# Patient Record
Sex: Male | Born: 1995 | Hispanic: No | Marital: Single | Smoking: Former smoker
Health system: Southern US, Community
[De-identification: ages and names within clinical notes are randomized; demographics above are authoritative.]

## PROBLEM LIST (undated history)

## (undated) DIAGNOSIS — L709 Acne, unspecified: Secondary | ICD-10-CM

## (undated) HISTORY — DX: Acne, unspecified: L70.9

---
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
## 2014-05-05 ENCOUNTER — Emergency Department: Payer: Self-pay | Admitting: Emergency Medicine

## 2015-06-11 ENCOUNTER — Emergency Department
Admission: EM | Admit: 2015-06-11 | Discharge: 2015-06-11 | Disposition: A | Discharge status: Home / Self Care | Payer: Self-pay | Attending: Emergency Medicine | Admitting: Emergency Medicine

## 2015-06-11 ENCOUNTER — Encounter: Payer: Self-pay | Admitting: Emergency Medicine

## 2015-06-11 DIAGNOSIS — K120 Recurrent oral aphthae: Secondary | ICD-10-CM | POA: Insufficient documentation

## 2015-06-11 NOTE — ED Provider Notes (Signed)
[facility] Emergency Department Provider Note ____________________________________________  Time seen: 1347  I have reviewed the triage vital signs and the nursing notes.  HISTORY  Chief Complaint  Lymphadenopathy  HPI Steven Yu is a 19 y.o. male reports to the ED for evaluation and treatment of some swelling to the gumline. He noted a bump on her tongue this morning, and has also found [place]: Tender lymph node on the same side of his neck. He does report incidentally, that he has a dental appointment scheduled for Monday. He denies any recent fevers, chills, sweats and has not had a difficulty with swallowing, speaking, or breathing.  History reviewed. No pertinent past medical history.  There are no active problems to display for this patient.  History reviewed. No pertinent past surgical history.  No current outpatient prescriptions on file.  Allergies Review of patient's allergies indicates no known allergies.  No family history on file.  Social History History  Substance Use Topics  . Smoking status: Never Smoker   . Smokeless tobacco: Not on file  . Alcohol Use: Yes   Review of Systems  Constitutional: Negative for fever. Eyes: Negative for visual changes. ENT: Negative for sore throat. Gum and tongue irritation as above. Cardiovascular: Negative for chest pain. Respiratory: Negative for shortness of breath. Gastrointestinal: Negative for abdominal pain, vomiting and diarrhea. Genitourinary: Negative for dysuria. Musculoskeletal: Negative for back pain. Skin: Negative for rash. Neurological: Negative for headaches, focal weakness or numbness. ____________________________________________  PHYSICAL EXAM:  VITAL SIGNS: ED Triage Vitals  Enc Vitals Group     BP 06/11/15 1250 144/74 mmHg     Pulse Rate 06/11/15 1250 64     Resp 06/11/15 1250 20     Temp 06/11/15 1250 98.4 F (36.9 C)     Temp Source 06/11/15 1250 Oral     SpO2  06/11/15 1250 100 %     Weight 06/11/15 1250 260 lb (117.935 kg)     Height 06/11/15 1250 6\' 2"  (1.88 m)     Head Cir --      Peak Flow --      Pain Score --      Pain Loc --      Pain Edu? --      Excl. in [place]? --    Constitutional: Alert and oriented. Well appearing and in no distress. Eyes: Conjunctivae are normal. PERRL. Normal extraocular movements. ENT   Head: Normocephalic and atraumatic.   Nose: No congestion/rhinnorhea.   Mouth/Throat: Mucous membranes are moist. Local erythema noted to single, shallow, aphthous ulcer to the left lateral tongue near the base.    Neck: Supple. No thyromegaly. Hematological/Lymphatic/Immunilogical: No cervical lymphadenopathy. Cardiovascular: Normal rate, regular rhythm.  Respiratory: Normal respiratory effort. No wheezes/rales/rhonchi. Musculoskeletal: Nontender with normal range of motion in all extremities.  Neurologic:  Normal gait without ataxia. Normal speech and language. No gross focal neurologic deficits are appreciated. Skin:  Skin is warm, dry and intact. No rash noted. Psychiatric: Mood and affect are normal. Patient exhibits appropriate insight and judgment. ____________________________________________  INITIAL IMPRESSION / ASSESSMENT AND PLAN / ED COURSE  Acute mouth pain due to aphthous ulcer. Will suggest treatment with Maalox/Benadryl swish & spit. Follow-up with primary provider or dental provider as scheduled.  ____________________________________________  FINAL CLINICAL IMPRESSION(S) / ED DIAGNOSES  Final diagnoses:  Aphthous ulcer     Melvenia Needles, PA-C 06/13/15 1647

## 2015-06-11 NOTE — ED Notes (Signed)
States he noticed some swelling to gumline and now has a swollen gland

## 2015-06-11 NOTE — Discharge Instructions (Signed)
Canker Sores  Canker sores are painful, open sores on the inside of the mouth and cheek. They may be white or yellow. The sores usually heal in 1 to 2 weeks. Women are more likely than men to have recurrent canker sores. CAUSES The cause of canker sores is not well understood. More than one cause is likely. Canker sores do not appear to be caused by certain types of germs (viruses or bacteria). Canker sores may be caused by:  An allergic reaction to certain foods.  Digestive problems.  Not having enough vitamin M09, folic acid, and iron.  Male sex hormones. Sores may come only during certain phases of a menstrual cycle. Often, there is improvement during pregnancy.  Genetics. Some people seem to inherit canker sore problems. Emotional stress and injuries to the mouth may trigger outbreaks, but not cause them.  DIAGNOSIS Canker sores are diagnosed by exam.  TREATMENT  Patients who have frequent bouts of canker sores may have cultures taken of the sores, blood tests, or allergy tests. This helps determine if their sores are caused by a poor diet, an allergy, or some other preventable or treatable disease.  Vitamins may prevent recurrences or reduce the severity of canker sores in people with poor nutrition.  Numbing ointments can relieve pain. These are available in drug stores without a prescription.  Anti-inflammatory steroid mouth rinses or gels may be prescribed by your caregiver for severe sores.  Oral steroids may be prescribed if you have severe, recurrent canker sores. These strong medicines can cause many side effects and should be used only under the close direction of a dentist or physician.  Mouth rinses containing the antibiotic medicine may be prescribed. They may lessen symptoms and speed healing. Healing usually happens in about 1 or 2 weeks with or without treatment. Certain antibiotic mouth rinses given to pregnant women and young children can permanently stain teeth.  Talk to your caregiver about your treatment. HOME CARE INSTRUCTIONS   Avoid foods that cause canker sores for you.  Avoid citrus juices, spicy or salty foods, and coffee until the sores are healed.  Use a soft-bristled toothbrush.  Chew your food carefully to avoid biting your cheek.  Apply topical numbing medicine to the sore to help relieve pain.  Apply a thin paste of baking soda and water to the sore to help heal the sore.  Only use mouth rinses or medicines for pain or discomfort as directed by your caregiver. SEEK MEDICAL CARE IF:   Your symptoms are not better in 1 week.  Your sores are still present after 2 weeks.  Your sores are very painful.  You have trouble breathing or swallowing.  Your sores come back frequently. Document Released: 02/17/2011 Document Revised: 02/17/2013 Document Reviewed: 02/17/2011 [facility] Patient Information 2015 [place], [place]. This information is not intended to replace advice given to you by your health care provider. Make sure you discuss any questions you have with your health care provider.  Consider mixing & gargling equal parts: Benadryl (diphenhydramine) elixir + Maalox (Mylanta) for mouth pain as needed.

## 2015-07-15 ENCOUNTER — Emergency Department: Payer: Self-pay

## 2015-07-15 ENCOUNTER — Emergency Department
Admission: EM | Admit: 2015-07-15 | Discharge: 2015-07-15 | Disposition: A | Discharge status: Home / Self Care | Payer: Self-pay | Attending: Emergency Medicine | Admitting: Emergency Medicine

## 2015-07-15 DIAGNOSIS — K219 Gastro-esophageal reflux disease without esophagitis: Secondary | ICD-10-CM | POA: Insufficient documentation

## 2015-07-15 DIAGNOSIS — E860 Dehydration: Secondary | ICD-10-CM | POA: Insufficient documentation

## 2015-07-15 DIAGNOSIS — R079 Chest pain, unspecified: Secondary | ICD-10-CM | POA: Insufficient documentation

## 2015-07-15 LAB — CBC WITH DIFFERENTIAL/PLATELET
Basophils Absolute: 0 10*3/uL (ref 0–0.1)
Basophils Relative: 0 %
EOS PCT: 1 %
Eosinophils Absolute: 0.1 10*3/uL (ref 0–0.7)
HCT: 45 % (ref 40.0–52.0)
HEMOGLOBIN: 14.9 g/dL (ref 13.0–18.0)
LYMPHS ABS: 2 10*3/uL (ref 1.0–3.6)
LYMPHS PCT: 19 %
MCH: 28.2 pg (ref 26.0–34.0)
MCHC: 33 g/dL (ref 32.0–36.0)
MCV: 85.3 fL (ref 80.0–100.0)
MONOS PCT: 9 %
Monocytes Absolute: 0.9 10*3/uL (ref 0.2–1.0)
Neutro Abs: 7.3 10*3/uL — ABNORMAL HIGH (ref 1.4–6.5)
Neutrophils Relative %: 71 %
PLATELETS: 298 10*3/uL (ref 150–440)
RBC: 5.28 MIL/uL (ref 4.40–5.90)
RDW: 12.8 % (ref 11.5–14.5)
WBC: 10.3 10*3/uL (ref 3.8–10.6)

## 2015-07-15 LAB — BASIC METABOLIC PANEL
Anion gap: 9 (ref 5–15)
BUN: 22 mg/dL — AB (ref 6–20)
CHLORIDE: 108 mmol/L (ref 101–111)
CO2: 23 mmol/L (ref 22–32)
CREATININE: 1.57 mg/dL — AB (ref 0.61–1.24)
Calcium: 9.6 mg/dL (ref 8.9–10.3)
GFR calc Af Amer: >60 mL/min (ref >60)
GFR calc non Af Amer: >60 mL/min (ref >60)
GLUCOSE: 93 mg/dL (ref 65–99)
POTASSIUM: 3.5 mmol/L (ref 3.5–5.1)
Sodium: 140 mmol/L (ref 135–145)

## 2015-07-15 LAB — TROPONIN I: Troponin I: <0.03 ng/mL (ref <0.031)

## 2015-07-15 MED ORDER — ALUM & MAG HYDROXIDE-SIMETH 200-200-20 MG/5ML PO SUSP
30.0000 mL | Freq: Once | ORAL | Status: AC
  Administered 2015-07-15: 30 mL via ORAL
  Filled 2015-07-15: qty 30

## 2015-07-15 MED ORDER — RANITIDINE HCL 150 MG PO TABS: ORAL_TABLET | ORAL | Status: DC

## 2015-07-15 NOTE — Discharge Instructions (Signed)
Your chest discomfort may have been due to some mild dehydration or it may have been due to esophageal reflux. 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 Take ranitidine as prescribed. Follow-up with another doctor for any ongoing symptoms. Return to the emergency department if you have worsening symptoms or other acute concerns.  Chest Pain (Nonspecific) It is often hard to give a diagnosis for the cause of chest pain. There is always a chance that your pain could be related to something serious, such as a heart attack or a blood clot in the lungs. You need to follow up with your doctor. HOME CARE  If antibiotic medicine was given, take it as directed by your doctor. Finish the medicine even if you start to feel better.  For the next few days, avoid activities that bring on chest pain. Continue physical activities as told by your doctor.  Do not use any tobacco products. This includes cigarettes, chewing tobacco, and e-cigarettes.  Avoid drinking alcohol.  Only take medicine as told by your doctor.  Follow your doctor's suggestions for more testing if your chest pain does not go away.  Keep all doctor visits you made. GET HELP IF:  Your chest pain does not go away, even after treatment.  You have a rash with blisters on your chest.  You have a fever. GET HELP RIGHT AWAY IF:   You have more pain or pain that spreads to your arm, neck, jaw, back, or belly (abdomen).  You have shortness of breath.  You cough more than usual or cough up blood.  You have very bad back or belly pain.  You feel sick to your stomach (nauseous) or throw up (vomit).  You have very bad weakness.  You pass out (faint).  You have chills. This is an emergency. Do not wait to see if the problems will go away. Call your local emergency services (911 in [place] Do not drive yourself to the hospital. MAKE SURE YOU:   Understand these instructions.  Will watch your condition.  Will get help  right away if you are not doing well or get worse. Document Released: 04/10/2008 Document Revised: 10/28/2013 Document Reviewed: 04/10/2008 [facility] Patient Information 2015 [place], [place]. This information is not intended to replace advice given to you by your health care provider. Make sure you discuss any questions you have with your health care provider.

## 2015-07-15 NOTE — ED Notes (Signed)
Pt arrived to room 2 via EMS,  Reports that he started having chest pain after he had been working in the heat.   midsternal chest pain, did not radiate, + diaphoresis- pt states that it lasted until he got in the ambulance ( approx 30 min).  Pt states pain is gone now and he is only c/o "heart burn" epigastric burning.  Pt reports that he has been having severe reflux for the past month but today is the first time he has felt pain lower than his throat-epigastrum.

## 2015-07-15 NOTE — ED Provider Notes (Signed)
[facility] Emergency Department Provider Note  ____________________________________________  Time seen:  1550  I have reviewed the triage vital signs and the nursing notes.   HISTORY  Chief Complaint Chest Pain     HPI Steven Yu is a 19 y.o. male who began to develop left-sided chest discomfort this afternoon after leaving work.  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 He had no difficulty while working. He left work on his large motorcycle (a CBR 1000) and as he was riding he began to develop an odd sensation, some tingling and pressure, in his left chest. He rode to a [employer] where he got his Sam which. When he began to eat the pain got even worse and he felt a bit weak and uncomfortable. He wondered if his blood sugar was dropping. 911 was called. His blood sugar was normal on their exam. He was placed in the ambulance to be brought to the emergency department. The patient reports that once in the ambulance his chest pain resolved. Total duration was likely to be about 30 minutes. He denies any nausea. He denies any shortness of breath.  Mr. Steven Yu does report that he has a long history of esophageal reflux, but I do not believe he's ever been evaluated for this. He denies ever taking any medications for it other than Tums.  He denies any other health issues and does not take any medications.   Past medical history: None except for possibility of gastroesophageal reflux disease.  There are no active problems to display for this patient.   No past surgical history on file.  Current Outpatient Rx  Name  Route  Sig  Dispense  Refill  . ranitidine (ZANTAC) 150 MG tablet      Take one tablet twice a day for one week then 1 tablet once a day for 2 more weeks.   28 tablet   1     Allergies Review of patient's allergies indicates no known allergies.  History reviewed. No pertinent family history.  Social  History Social History  Substance Use Topics  . Smoking status: Never Smoker   . Smokeless tobacco: None  . Alcohol Use: No    Review of Systems  Constitutional: Negative for fever. ENT: Negative for sore throat. Cardiovascular: Positive for chest pain. Respiratory: Negative for shortness of breath. Gastrointestinal: Negative for abdominal pain, vomiting and diarrhea. Genitourinary: Negative for dysuria. Musculoskeletal: No myalgias or injuries. Skin: Negative for rash. Neurological: Negative for headaches   10-point ROS otherwise negative.  ____________________________________________   PHYSICAL EXAM:  VITAL SIGNS: ED Triage Vitals  Enc Vitals Group     BP --      Pulse --      Resp --      Temp --      Temp src --      SpO2 --      Weight --      Height --      Head Cir --      Peak Flow --      Pain Score --      Pain Loc --      Pain Edu? --      Excl. in [place]? --     Constitutional: Alert and oriented. Well appearing and in no distress. ENT   Head: Normocephalic and atraumatic.   Nose: No congestion/rhinnorhea.   Mouth/Throat: Mucous membranes are moist. Cardiovascular: Normal rate, regular rhythm, no murmur noted  Chest wall: No tenderness, no deformity. Respiratory:  Normal respiratory effort, no tachypnea.    Breath sounds are clear and equal bilaterally.  Gastrointestinal: Soft and nontender. No distention.  Back: No muscle spasm, no tenderness, no CVA tenderness. Musculoskeletal: No deformity noted. Nontender with normal range of motion in all extremities.  No noted edema. Neurologic:  Normal speech and language. No gross focal neurologic deficits are appreciated.  Skin:  Skin is warm, dry. No rash noted. Psychiatric: Pleasant, calm, interactive. Mood and affect are normal. Speech and behavior are normal.  ____________________________________________    LABS (pertinent positives/negatives)  Labs Reviewed  CBC WITH  DIFFERENTIAL/PLATELET - Abnormal; Notable for the following:    Neutro Abs 7.3 (*)    All other components within normal limits  BASIC METABOLIC PANEL - Abnormal; Notable for the following:    BUN 22 (*)    Creatinine, Ser 1.57 (*)    All other components within normal limits  TROPONIN I     ____________________________________________   EKG  ED ECG REPORT I, Korrie Hofbauer W, the attending physician, personally viewed and interpreted this ECG.   Date: 07/15/2015  EKG Time: 1546  Rate: 91  Rhythm: Normal sinus rhythm  Axis: Normal  Intervals: Normal  ST&T Change: None noted   ____________________________________________    RADIOLOGY  Chest x-ray:  IMPRESSION: No active cardiopulmonary disease.  ____________________________________________   INITIAL IMPRESSION / ASSESSMENT AND PLAN / ED COURSE  Pertinent labs & imaging results that were available during my care of the patient were reviewed by me and considered in my medical decision making (see chart for details).  Pleasant, calm, 19 year old male in no acute distress who reports he had discomfort in his left chest. This is resolved. The patient appears to be at very low risk for this being a cardiac event. He has no chest wall tenderness. He does report a history of esophageal reflux and is most likely related to that. We will treat him with Maalox. We will check an EKG and troponin.   ----------------------------------------- 5:18 PM on 07/15/2015 -----------------------------------------  Lab tests are overall unremarkable. The patient does have a slight elevation in BUN of 22 and creatinine at 1.57. The patient does have an IV and has received IV fluids.  His troponin is negative as is his chest x-ray. On reassessment the patient reports he feels much better. He looks better as well. He looks comfortable. We will discharge him home. I counseled him to be sure to drink more fluids when he is working outside. He  reports he drank approximately 2 quarts today. Given that the temperature got to be in the high 90s today, I counseled him he may need to drink as much as 6 quarts.  ____________________________________________   FINAL CLINICAL IMPRESSION(S) / ED DIAGNOSES  Final diagnoses:  Chest pain, unspecified chest pain type  Dehydration  Gastroesophageal reflux disease, esophagitis presence not specified      Ahmed Prima, MD 07/15/15 [phone]

## 2015-08-24 ENCOUNTER — Encounter: Payer: Self-pay | Admitting: Emergency Medicine

## 2015-08-24 ENCOUNTER — Emergency Department
Admission: EM | Admit: 2015-08-24 | Discharge: 2015-08-24 | Disposition: A | Discharge status: Home / Self Care | Payer: Self-pay | Attending: Emergency Medicine | Admitting: Emergency Medicine

## 2015-08-24 ENCOUNTER — Emergency Department: Payer: Self-pay

## 2015-08-24 DIAGNOSIS — Y998 Other external cause status: Secondary | ICD-10-CM | POA: Insufficient documentation

## 2015-08-24 DIAGNOSIS — X58XXXA Exposure to other specified factors, initial encounter: Secondary | ICD-10-CM | POA: Insufficient documentation

## 2015-08-24 DIAGNOSIS — S9031XA Contusion of right foot, initial encounter: Secondary | ICD-10-CM | POA: Insufficient documentation

## 2015-08-24 DIAGNOSIS — Y9289 Other specified places as the place of occurrence of the external cause: Secondary | ICD-10-CM | POA: Insufficient documentation

## 2015-08-24 DIAGNOSIS — Y9339 Activity, other involving climbing, rappelling and jumping off: Secondary | ICD-10-CM | POA: Insufficient documentation

## 2015-08-24 MED ORDER — IBUPROFEN 800 MG PO TABS
800.0000 mg | ORAL_TABLET | Freq: Once | ORAL | Status: AC
  Administered 2015-08-24: 800 mg via ORAL

## 2015-08-24 MED ORDER — IBUPROFEN 800 MG PO TABS: 800.0000 mg | ORAL_TABLET | Freq: Three times a day (TID) | ORAL | Status: DC | PRN

## 2015-08-24 MED ORDER — HYDROCODONE-ACETAMINOPHEN 5-325 MG PO TABS
2.0000 | ORAL_TABLET | Freq: Once | ORAL | Status: AC
  Administered 2015-08-24: 2 via ORAL

## 2015-08-24 NOTE — Discharge Instructions (Signed)

## 2015-08-24 NOTE — ED Provider Notes (Signed)
[facility] Emergency Department Provider Note  ____________________________________________  Time seen: Approximately 11:17 PM  I have reviewed the triage vital signs and the nursing notes.   HISTORY  Chief Complaint Foot Pain    HPI Steven Yu is a 19 y.o. male who presents for evaluation of right foot pain after jumping off a wall. Patient states that he previously injured it and reinjured again tonight. Complains of increased pain to his heel.   No past medical history on file.  There are no active problems to display for this patient.   History reviewed. No pertinent past surgical history.  Current Outpatient Rx  Name  Route  Sig  Dispense  Refill  . ibuprofen (ADVIL,MOTRIN) 800 MG tablet   Oral   Take 1 tablet (800 mg total) by mouth every 8 (eight) hours as needed.   30 tablet   0   . ranitidine (ZANTAC) 150 MG tablet      Take one tablet twice a day for one week then 1 tablet once a day for 2 more weeks.   28 tablet   1     Allergies Review of patient's allergies indicates no known allergies.  No family history on file.  Social History Social History  Substance Use Topics  . Smoking status: Never Smoker   . Smokeless tobacco: None  . Alcohol Use: No    Review of Systems Constitutional: No fever/chills Eyes: No visual changes. ENT: No sore throat. Cardiovascular: Denies chest pain. Respiratory: Denies shortness of breath. Gastrointestinal: No abdominal pain.  No nausea, no vomiting.  No diarrhea.  No constipation. Genitourinary: Negative for dysuria. Musculoskeletal: Positive for right foot pain Skin: Negative for rash. Neurological: Negative for headaches, focal weakness or numbness.  10-point ROS otherwise negative.  ____________________________________________   PHYSICAL EXAM:  VITAL SIGNS: ED Triage Vitals  Enc Vitals Group     BP 08/24/15 2239 140/100 mmHg     Pulse Rate 08/24/15 2239 112   Resp 08/24/15 2239 20     Temp 08/24/15 2239 97.9 F (36.6 C)     Temp Source 08/24/15 2239 Oral     SpO2 08/24/15 2239 98 %     Weight 08/24/15 2239 285 lb (129.275 kg)     Height 08/24/15 2239 6\' 2"  (1.88 m)     Head Cir --      Peak Flow --      Pain Score 08/24/15 2237 8     Pain Loc --      Pain Edu? --      Excl. in [place]? --     Constitutional: Alert and oriented. Well appearing and in no acute distress. Cardiovascular: Normal rate, regular rhythm. Grossly normal heart sounds.  Good peripheral circulation. Respiratory: Normal respiratory effort.  No retractions. Lungs CTAB. Musculoskeletal: No lower extremity tenderness nor edema.  No joint effusions. Neurologic:  Normal speech and language. No gross focal neurologic deficits are appreciated. No gait instability. Skin:  Skin is warm, dry and intact. No rash noted. Psychiatric: Mood and affect are normal. Speech and behavior are normal.  ____________________________________________   LABS (all labs ordered are listed, but only abnormal results are displayed)  Labs Reviewed - No data to display ____________________________________________  RADIOLOGY  FINDINGS: There is no evidence of fracture or dislocation. The joint spaces are preserved. There is no evidence of talar subluxation; the subtalar joint is unremarkable in appearance. There is a bipartite medial sesamoid of the first toe.  No  significant soft tissue abnormalities are seen.  IMPRESSION: 1. No evidence of fracture or dislocation. 2. 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 ____________________________________________   PROCEDURES  Procedure(s) performed: None  Critical Care performed: No  ____________________________________________   INITIAL IMPRESSION / ASSESSMENT AND PLAN / ED COURSE  Pertinent labs & imaging results that were available during my care of the patient were reviewed by me and considered in my medical decision making (see  chart for details).  Acute right foot contusion. Patient placed in a posterior splint and encouraged crutches use for the next 2-3 days. Follow up with orthopedic on call if no relief or change in one week. Rx Motrin 800 mg 3 times a day given. ____________________________________________   FINAL CLINICAL IMPRESSION(S) / ED DIAGNOSES  Final diagnoses:  Foot contusion, right, initial encounter      Arlyss Repress, PA-C 08/24/15 [place], MD 08/25/15 [phone]

## 2015-08-24 NOTE — ED Notes (Signed)
Pt to triage via w/c with no distress noted; pt reports injuring right foot/heel approx 5wks ago after jumping off wall; tonight reinjured and c/o pain since

## 2015-08-24 NOTE — ED Notes (Signed)
Patient transported to X-ray 

## 2015-09-18 ENCOUNTER — Emergency Department: Payer: Self-pay

## 2015-09-18 ENCOUNTER — Emergency Department
Admission: EM | Admit: 2015-09-18 | Discharge: 2015-09-18 | Disposition: A | Discharge status: Home / Self Care | Payer: Self-pay | Attending: Emergency Medicine | Admitting: Emergency Medicine

## 2015-09-18 DIAGNOSIS — D179 Benign lipomatous neoplasm, unspecified: Secondary | ICD-10-CM | POA: Insufficient documentation

## 2015-09-18 DIAGNOSIS — Z79899 Other long term (current) drug therapy: Secondary | ICD-10-CM | POA: Insufficient documentation

## 2015-09-18 DIAGNOSIS — M940 Chondrocostal junction syndrome [Tietze]: Secondary | ICD-10-CM | POA: Insufficient documentation

## 2015-09-18 MED ORDER — NAPROXEN 500 MG PO TABS: 500.0000 mg | ORAL_TABLET | Freq: Two times a day (BID) | ORAL | Status: AC

## 2015-09-18 NOTE — Discharge Instructions (Signed)
Costochondritis  Costochondritis is a condition in which the tissue (cartilage) that connects your ribs with your breastbone (sternum) becomes irritated. It causes pain in the chest and rib area. It usually goes away on its own over time.  HOME CARE  · Avoid activities that wear you out.  · Do not strain your ribs. Avoid activities that use your:    Chest.    Belly.    Side muscles.  · Put ice on the area for the first 2 days after the pain starts.    Put ice in a plastic bag.    Place a towel between your skin and the bag.    Leave the ice on for 20 minutes, 2-3 times a day.  · Only take medicine as told by your doctor.  GET HELP IF:  · You have redness or puffiness (swelling) in the rib area.  · Your pain does not go away with rest or medicine.  GET HELP RIGHT AWAY IF:   · Your pain gets worse.  · You are very uncomfortable.  · You have trouble breathing.  · You cough up blood.  · You start sweating or throwing up (vomiting).  · You have a fever or lasting symptoms for more than 2-3 days.  · You have a fever and your symptoms suddenly get worse.  MAKE SURE YOU:   · Understand these instructions.  · Will watch your condition.  · Will get help right away if you are not doing well or get worse.     This information is not intended to replace advice given to you by your health care provider. Make sure you discuss any questions you have with your health care provider.     Document Released: 04/10/2008 Document Revised: 06/25/2013 Document Reviewed: 05/27/2013  [employer]vier Interactive Patient Education ©2016 [employer]

## 2015-09-18 NOTE — ED Provider Notes (Signed)
[facility] Emergency Department Provider Note  ____________________________________________  Time seen: Approximately 2:06 PM  I have reviewed the triage vital signs and the nursing notes.   HISTORY  Chief Complaint Pleurisy    HPI Steven Yu is a 19 y.o. male patient complain of tenderness to left side of his chest. Patient stated there is a increase pain with palpation and deep inspiration. Patient also concern about a lump is on the left side of his chest has been there for the past 2 weeks however is not palpable today. He stated he does have a history of lipomas. Patient currently rates his pain as a 3/10.No palliative measures taken for this complaint. Patient denies any dyspnea or shortness of breath.   History reviewed. No pertinent past medical history.  There are no active problems to display for this patient.   History reviewed. No pertinent past surgical history.  Current Outpatient Rx  Name  Route  Sig  Dispense  Refill  . ibuprofen (ADVIL,MOTRIN) 800 MG tablet   Oral   Take 1 tablet (800 mg total) by mouth every 8 (eight) hours as needed.   30 tablet   0   . ranitidine (ZANTAC) 150 MG tablet      Take one tablet twice a day for one week then 1 tablet once a day for 2 more weeks.   28 tablet   1     Allergies Review of patient's allergies indicates no known allergies.  No family history on file.  Social History Social History  Substance Use Topics  . Smoking status: Never Smoker   . Smokeless tobacco: None  . Alcohol Use: No    Review of Systems Constitutional: No fever/chills Eyes: No visual changes. ENT: No sore throat. Cardiovascular: Denies chest pain. Respiratory: Denies shortness of breath. Gastrointestinal: No abdominal pain.  No nausea, no vomiting.  No diarrhea.  No constipation. Genitourinary: Negative for dysuria. Musculoskeletal: Chest wall pain Skin: Negative for rash. Neurological: Negative for  headaches, focal weakness or numbness. 10-point ROS otherwise negative.  ____________________________________________   PHYSICAL EXAM:  VITAL SIGNS: ED Triage Vitals  Enc Vitals Group     BP 09/18/15 1323 138/88 mmHg     Pulse Rate 09/18/15 1323 58     Resp 09/18/15 1323 17     Temp 09/18/15 1323 98.4 F (36.9 C)     Temp Source 09/18/15 1323 Oral     SpO2 09/18/15 1323 98 %     Weight 09/18/15 1321 280 lb (127.007 kg)     Height 09/18/15 1321 6\' 2"  (1.88 m)     Head Cir --      Peak Flow --      Pain Score 09/18/15 1322 3     Pain Loc --      Pain Edu? --      Excl. in [place]? --     Constitutional: Alert and oriented. Well appearing and in no acute distress. Eyes: Conjunctivae are normal. PERRL. EOMI. Head: Atraumatic. Nose: No congestion/rhinnorhea. Mouth/Throat: Mucous membranes are moist.  Oropharynx non-erythematous. Neck: No stridor.  No cervical spine tenderness to palpation. Hematological/Lymphatic/Immunilogical: No cervical lymphadenopathy. Cardiovascular: Normal rate, regular rhythm. Grossly normal heart sounds.  Good peripheral circulation. Respiratory: Normal respiratory effort.  No retractions. Lungs CTAB. Gastrointestinal: Soft and nontender. No distention. No abdominal bruits. No CVA tenderness. Musculoskeletal: No lower extremity tenderness nor edema.  No joint effusions. Neurologic:  Normal speech and language. No gross focal neurologic deficits are  appreciated. No gait instability. Skin:  Skin is warm, dry and intact. No rash noted. Psychiatric: Mood and affect are normal. Speech and behavior are normal.  ____________________________________________   LABS (all labs ordered are listed, but only abnormal results are displayed)  Labs Reviewed - No data to display ____________________________________________  EKG  EKG showed on the sinus rhythm. EKG was evaluated by heart station doctor. ____________________________________________  RADIOLOGY  Sex  x-ray was grossly unremarkable. ____________________________________________   PROCEDURES  Procedure(s) performed:   Critical Care performed:   ____________________________________________   INITIAL IMPRESSION / ASSESSMENT AND PLAN / ED COURSE  Pertinent labs & imaging results that were available during my care of the patient were reviewed by me and considered in my medical decision making (see chart for details).  Costochondritis. Discussed negative chest x-ray results with patient. Discussed the sequela of his current diagnosis. Patient given a prescription for naproxen take as directed. Patient advised to follow-up with the family doctor if his complaint persists. Advised return to ER if condition worsens. ____________________________________________   FINAL CLINICAL IMPRESSION(S) / ED DIAGNOSES  Final diagnoses:  Costochondritis  Lipoma      Sable Feil, PA-C 09/18/15 1448  Delman Kitten, MD 09/18/15 [phone]

## 2015-09-18 NOTE — ED Notes (Signed)
Pt c/o tenderness to the left side of chest , tender to touch, states he has felt a lump on the left side for the past 2 weeks.Marland Kitchen

## 2015-11-06 ENCOUNTER — Encounter: Payer: Self-pay | Admitting: Emergency Medicine

## 2016-06-05 ENCOUNTER — Other Ambulatory Visit: Payer: Self-pay | Admitting: Nurse Practitioner

## 2016-06-05 DIAGNOSIS — N63 Unspecified lump in unspecified breast: Secondary | ICD-10-CM

## 2016-06-09 ENCOUNTER — Ambulatory Visit
Admission: RE | Admit: 2016-06-09 | Discharge: 2016-06-09 | Disposition: A | Discharge status: Home / Self Care | Payer: Self-pay | Source: Ambulatory Visit | Attending: Nurse Practitioner | Admitting: Nurse Practitioner

## 2016-06-09 DIAGNOSIS — N63 Unspecified lump in unspecified breast: Secondary | ICD-10-CM

## 2017-10-22 ENCOUNTER — Encounter: Payer: Self-pay | Admitting: Nurse Practitioner

## 2017-10-22 ENCOUNTER — Ambulatory Visit: Payer: Self-pay | Admitting: Nurse Practitioner

## 2017-10-22 VITALS — BP 128/80 | HR 72 | Ht 73.0 in | Wt 322.8 lb

## 2017-10-22 DIAGNOSIS — E889 Metabolic disorder, unspecified: Secondary | ICD-10-CM | POA: Insufficient documentation

## 2017-10-22 DIAGNOSIS — E291 Testicular hypofunction: Secondary | ICD-10-CM | POA: Insufficient documentation

## 2017-10-22 DIAGNOSIS — E785 Hyperlipidemia, unspecified: Secondary | ICD-10-CM

## 2017-10-22 DIAGNOSIS — R2230 Localized swelling, mass and lump, unspecified upper limb: Secondary | ICD-10-CM

## 2017-10-22 MED ORDER — "SYRINGE 25G X 1"" 3 ML MISC": 2 refills | Status: DC

## 2017-10-22 MED ORDER — TESTOSTERONE 200 MG IL PLLT: 200.0000 mg | PELLET | 2 refills | Status: DC

## 2017-10-22 NOTE — Progress Notes (Signed)
Subjective:     Patient ID: Steven Yu, male   DOB: [birth year], 21 y.o.   MRN: [id number]  Superficial right chest pain associated with breast lump which has been evaluated and deemed benign. He is currently on testosterone injections 300mg  every 2 weeks. He is due to have refill. He is also due to have labs checked . He has noted a small, circular lump in middle of right forearm. Not tender. Denies injury to the area. It is not tender.      Review of Systems  Constitutional: Negative.   HENT: Negative for sore throat.   Eyes: Negative.   Respiratory: Negative for cough, shortness of breath and wheezing.   Cardiovascular: Negative for chest pain and palpitations.  Gastrointestinal: Negative.  Negative for constipation, diarrhea, nausea and vomiting.  Endocrine:       Chronic testosterone deficit  Genitourinary: Negative.   Musculoskeletal: Negative.   Skin: Negative.   Allergic/Immunologic: Negative.   Neurological: Negative.   Hematological: Negative.   Psychiatric/Behavioral: Negative.        Objective:   Physical Exam  Constitutional: He is oriented to person, place, and time. He appears well-developed and well-nourished.  HENT:  Head: Normocephalic and atraumatic.  Neck: Normal range of motion. Neck supple.  Cardiovascular: Normal rate and regular rhythm.  Pulmonary/Chest: Effort normal and breath sounds normal.  Abdominal: Soft. Bowel sounds are normal. There is no tenderness.  Musculoskeletal: Normal range of motion.  Lymphadenopathy:  Small 0.5cm nosduar denisty in right forearm . Round, smooth, and non-tender. It is not fixed in place.   Neurological: He is alert and oriented to person, place, and time.  Skin: Skin is warm and dry.  Psychiatric: He has a normal mood and affect.       Assessment:    Hyperlipidemia, unspecified hyperlipidemia type - Plan: Lipid Profile, Comprehensive Metabolic Panel (CMET)  Testicular hypofunction - Plan: Testosterone 200  MG PLLT, Syringe/Needle, Disp, (SYRINGE 3CC/25GX1") 25G X 1" 3 ML MISC, Testosterone, Prolactin  Localized swelling of forearm     Plan:     1. Testicular hypofunction - reill current rx for testosterone 300mg  every other week. Check testosterone levels as well as prolactin lefvel.  2. Hyperlipidemia - check fasting lipid panel and CMP. Adjust atorvastatin as indicated.  3. Localized swelling of forearm - bening palpable nodular mass. Continue to monitor. Will ultrasound nad refer as indicated.

## 2017-10-23 ENCOUNTER — Other Ambulatory Visit: Payer: Self-pay | Admitting: Nurse Practitioner

## 2017-10-23 ENCOUNTER — Telehealth: Payer: Self-pay | Admitting: Nurse Practitioner

## 2017-11-08 NOTE — Telephone Encounter (Signed)
Error

## 2018-01-22 ENCOUNTER — Ambulatory Visit: Payer: Self-pay | Admitting: Nurse Practitioner

## 2018-02-15 ENCOUNTER — Ambulatory Visit: Payer: Self-pay | Admitting: Nurse Practitioner

## 2018-02-15 ENCOUNTER — Encounter: Payer: Self-pay | Admitting: Nurse Practitioner

## 2018-02-15 VITALS — BP 137/73 | HR 78 | Resp 16 | Ht 73.5 in | Wt 310.8 lb

## 2018-02-15 DIAGNOSIS — N62 Hypertrophy of breast: Secondary | ICD-10-CM | POA: Insufficient documentation

## 2018-02-15 DIAGNOSIS — E291 Testicular hypofunction: Secondary | ICD-10-CM

## 2018-02-15 NOTE — Progress Notes (Signed)
[facility] [place], [place] [address]  Internal MEDICINE  Office Visit Note  Patient Name: Steven Yu  [id number]  [id number]  Date of Service: 02/15/2018  Chief Complaint  Patient presents with  . Follow-up    decreased testosterone levels.     The patient is here for routine follow up visit. He currently takes testosterone injections every other week. Has noted no worsening of symptoms. Still c/o gynecomastia. No changes noted.    Pt is here for routine follow up.    Current Medication: Outpatient Encounter Medications as of 02/15/2018  Medication Sig  . Syringe/Needle, Disp, (SYRINGE 3CC/25GX1") 25G X 1" 3 ML MISC To use with testosterone injection every other week  . Testosterone 200 MG PLLT Inject 200 mg into the muscle every 14 (fourteen) days. Inject 1.5 ml   im every 2 weeks  . atorvastatin (LIPITOR) 10 MG tablet Take 10 mg by mouth daily.  Marland Kitchen ibuprofen (ADVIL,MOTRIN) 800 MG tablet Take 1 tablet (800 mg total) by mouth every 8 (eight) hours as needed. (Patient not taking: Reported on 10/22/2017)  . ranitidine (ZANTAC) 150 MG tablet Take one tablet twice a day for one week then 1 tablet once a day for 2 more weeks. (Patient not taking: Reported on 10/22/2017)   No facility-administered encounter medications on file as of 02/15/2018.     Surgical History: History reviewed. No pertinent surgical history.  Medical History: History reviewed. No pertinent past medical history.  Family History: Family History  Problem Relation Age of Onset  . Depression Mother   . Hypertension Father     Social History   Socioeconomic History  . Marital status: Single    Spouse name: Not on file  . Number of children: Not on file  . Years of education: Not on file  . Highest education level: Not on file  Occupational History  . Not on file  Social Needs  . Financial resource strain: Not on file  . Food insecurity:    Worry: Not on file   Inability: Not on file  . Transportation needs:    Medical: Not on file    Non-medical: Not on file  Tobacco Use  . Smoking status: Current Every Day Smoker  . Smokeless tobacco: Never Used  . Tobacco comment: marijuana  Substance and Sexual Activity  . Alcohol use: No  . Drug use: Yes    Types: Marijuana    Comment: socially  . Sexual activity: Yes  Lifestyle  . Physical activity:    Days per week: Not on file    Minutes per session: Not on file  . Stress: Not on file  Relationships  . Social connections:    Talks on phone: Not on file    Gets together: Not on file    Attends religious service: Not on file    Active member of club or organization: Not on file    Attends meetings of clubs or organizations: Not on file    Relationship status: Not on file  . Intimate partner violence:    Fear of current or ex partner: Not on file    Emotionally abused: Not on file    Physically abused: Not on file    Forced sexual activity: Not on file  Other Topics Concern  . Not on file  Social History Narrative  . Not on file      Review of Systems  Constitutional: Negative for activity change, chills, fatigue and unexpected  weight change.  HENT: Negative for congestion, postnasal drip, rhinorrhea, sneezing and sore throat.   Eyes: Negative for redness.  Respiratory: Negative for cough, chest tightness, shortness of breath and wheezing.   Cardiovascular: Negative for chest pain and palpitations.  Gastrointestinal: Negative for abdominal pain, constipation, diarrhea, nausea and vomiting.  Endocrine: Negative for cold intolerance, heat intolerance, polydipsia, polyphagia and polyuria.       Testicular hypofunction.   Genitourinary: Negative for dysuria and frequency.  Musculoskeletal: Negative for arthralgias, back pain, joint swelling and neck pain.  Skin: Negative for rash.  Allergic/Immunologic: Negative for environmental allergies.  Neurological: Negative for tremors, numbness  and headaches.  Hematological: Negative for adenopathy. Does not bruise/bleed easily.  Psychiatric/Behavioral: Negative for behavioral problems (Depression), sleep disturbance and suicidal ideas. The patient is not nervous/anxious and is not hyperactive.     Today's Vitals   02/15/18 1422  BP: 137/73  Pulse: 78  Resp: 16  SpO2: 95%  Weight: (!) 310 lb 12.8 oz (141 kg)  Height: 6' 1.5" (1.867 m)    Physical Exam  Constitutional: He is oriented to person, place, and time.  HENT:  Head: Normocephalic and atraumatic.  Eyes: Pupils are equal, round, and reactive to light. EOM are normal.  Neck: Normal range of motion. Neck supple. No thyromegaly present.  Cardiovascular: Normal rate, regular rhythm and normal heart sounds.  Pulmonary/Chest: Effort normal and breath sounds normal. He has no wheezes.  Unchanged gynecomastia  Abdominal: Soft. Bowel sounds are normal. There is no tenderness.  Musculoskeletal: Normal range of motion.  Neurological: He is alert and oriented to person, place, and time.  Skin: Skin is warm and dry.  Psychiatric: He has a normal mood and affect. His behavior is normal. Judgment and thought content normal.  Nursing note and vitals reviewed.   Assessment/Plan: 1. Testicular hypofunction Currently on testosterone injections 200mg  per week. Will check testosterone and prolactin levels and adjust dosing as indicated/ Discussed future need for regular visits with endocrinologist. He did voice understanding.   2. Gynecomastia Check testosterone and prolactin levels and adjust testosterone dosing as indicated.   General Counseling: [facility] verbalizes understanding of the findings of todays visit and agrees with plan of treatment. I have discussed any further diagnostic evaluation that may be needed or ordered today. We also reviewed his medications today. he has been encouraged to call the office with any questions or concerns that should arise related to todays  visit.    This patient was seen by Leretha Pol, FNP- C in Collaboration with Dr Lavera Guise as a part of collaborative care agreement   Time spent: 55  Minutes      Dr Lavera Guise Internal medicine

## 2018-06-20 ENCOUNTER — Ambulatory Visit: Payer: Self-pay | Admitting: Adult Health

## 2018-06-20 ENCOUNTER — Encounter: Payer: Self-pay | Admitting: Adult Health

## 2018-06-20 VITALS — BP 145/96 | HR 105 | Resp 16 | Ht 74.0 in | Wt 301.4 lb

## 2018-06-20 DIAGNOSIS — E785 Hyperlipidemia, unspecified: Secondary | ICD-10-CM

## 2018-06-20 DIAGNOSIS — E291 Testicular hypofunction: Secondary | ICD-10-CM

## 2018-06-20 DIAGNOSIS — N62 Hypertrophy of breast: Secondary | ICD-10-CM

## 2018-06-20 DIAGNOSIS — R030 Elevated blood-pressure reading, without diagnosis of hypertension: Secondary | ICD-10-CM

## 2018-06-20 NOTE — Patient Instructions (Signed)
Testosterone Why am I having this test? Testosterone is a hormone made by the male's testicles and by the adrenal glands, which are a pair of glands on top of the kidneys. Starting at puberty, testosterone stimulates the development of secondary sex characteristics. This includes a deeper voice, growth of muscles and body hair, and penis enlargement. Females also produce testosterone in both the adrenal glands and ovaries. A male's body converts testosterone into estradiol, the main male sex hormone. An abnormal level of testosterone can cause health issues in both males and females. You may have this test if your health care provider suspects that an abnormal testosterone level is causing or contributing to other health problems. In males, symptoms of an abnormal testosterone level include:  Infertility.  Erectile dysfunction.  Delayed puberty or premature puberty.  In females, symptoms of an abnormally high testosterone level include:  Infertility.  Polycystic ovarian syndrome (PCOS).  Developing masculine features (virilization).  What kind of sample is taken? This test requires a blood sample taken from a vein in your arm or hand. The sample for this test is usually collected in the morning. The amount of testosterone in your blood is highest at that time. What do the results mean? It is your responsibility to obtain your test results. Ask the lab or department performing the test when and how you will get your results. Contact your health care provider to discuss any questions you have about your results. The result of a blood test for testosterone will be given as a range of values. A testosterone level that is outside the normal range may indicate a health problem. Testosterone is measured in nanograms per deciliter (ng/dL). Range of normal values Ranges for normal values may vary among different labs and hospitals. You should always check with your health care provider after  having lab work or other tests done to discuss whether your values are considered within normal limits. Normal levels of total testosterone are as follows:  Male: ? 7 months to 23 years old: less than 30 ng/dL. ? 10-13 years old: less than 300 ng/dL. ? 14-15 years old: 170-540 ng/dL. ? 16-19 years old: 250-910 ng/dL. ? 23 years old and over: 280-1,080 ng/dL.  Male: ? 7 months to 23 years old: less than 30 ng/dL. ? 10-13 years old: less than 40 ng/dL. ? 14-15 years old: less than 60 ng/dL. ? 16-19 years old: less than 70 ng/dL. ? 23 years old and over: less than 70 ng/dL.  Meaning of results outside normal value ranges A testosterone level that is too low or too high can indicate a number of health problems. In males:  A high testosterone level can occur if you: ? Have certain types of tumors. ? Have an overactive thyroid gland (hyperthyroidism). ? Use anabolic steroids. ? Are starting puberty early (precocious puberty). ? Have an inherited disorder that affects the adrenal glands (congenital adrenal hyperplasia).  A low testosterone level can occur if you: ? Have certain genetic diseases. ? Have had certain viral infections, such as mumps. ? Have pituitary disease. ? Have had an injury to the testicles. ? Are an alcoholic.  In females:  A high testosterone level can occur if you have: ? Certain types of tumors. ? An inherited disorder that affects certain cells in the adrenal glands (congenital adrenocortical hyperplasia). ? PCOS.  A low testosterone level does not cause health problems.  Discuss the results of your testosterone test with your health care provider. Your health care   provider will use the results of this test and other tests to make a diagnosis. Talk with your health care provider to discuss your results, treatment options, and if necessary, the need for more tests. Talk with your health care provider if you have any questions about your results. This  information is not intended to replace advice given to you by your health care provider. Make sure you discuss any questions you have with your health care provider. Document Released: 11/09/2004 Document Revised: 06/24/2016 Document Reviewed: 02/18/2014 Elsevier Interactive Patient Education  2018 [employer]  

## 2018-06-20 NOTE — Progress Notes (Signed)
[facility] [place], [place] [address]  Internal MEDICINE  Office Visit Note  Patient Name: Steven Yu  [id number]  [id number]  Date of Service: 07/11/2018  Chief Complaint  Patient presents with  . 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  He has not had his levels drawn since last visit because he has to pay [facility] before they will draw more blood from him.  Pt reports he is starting process to apply for insurance. He has not had a testosterone injection in approximately 2 months.  He denies side effects or new symptoms.  We discussed importance of seeing blood levels before giving him more testosterone. He reports he is not taking his Lipitor either due to financial reasons.      Current Medication: Outpatient Encounter Medications as of 06/20/2018  Medication Sig  . Syringe/Needle, Disp, (SYRINGE 3CC/25GX1") 25G X 1" 3 ML MISC To use with testosterone injection every other week  . Testosterone 200 MG PLLT Inject 200 mg into the muscle every 14 (fourteen) days. Inject 1.5 ml   im every 2 weeks  . atorvastatin (LIPITOR) 10 MG tablet Take 10 mg by mouth daily.  Marland Kitchen ibuprofen (ADVIL,MOTRIN) 800 MG tablet Take 1 tablet (800 mg total) by mouth every 8 (eight) hours as needed. (Patient not taking: Reported on 10/22/2017)  . ranitidine (ZANTAC) 150 MG tablet Take one tablet twice a day for one week then 1 tablet once a day for 2 more weeks. (Patient not taking: Reported on 10/22/2017)   No facility-administered encounter medications on file as of 06/20/2018.     Surgical History: History reviewed. No pertinent surgical history.  Medical History: Past Medical History:  Diagnosis Date  . Acne     Family History: Family History  Problem Relation Age of Onset  . Depression Mother   . Hypertension Father     Social History   Socioeconomic History  . Marital status: Single    Spouse name: Not on file  .  Number of children: Not on file  . Years of education: Not on file  . Highest education level: Not on file  Occupational History  . Not on file  Social Needs  . Financial resource strain: Not on file  . Food insecurity:    Worry: Not on file    Inability: Not on file  . Transportation needs:    Medical: Not on file    Non-medical: Not on file  Tobacco Use  . Smoking status: Former Research scientist (life sciences)  . Smokeless tobacco: Never Used  . Tobacco comment: marijuana  Substance and Sexual Activity  . Alcohol use: No  . Drug use: Not Currently    Types: Marijuana    Comment: socially  . Sexual activity: Yes  Lifestyle  . Physical activity:    Days per week: Not on file    Minutes per session: Not on file  . Stress: Not on file  Relationships  . Social connections:    Talks on phone: Not on file    Gets together: Not on file    Attends religious service: Not on file    Active member of club or organization: Not on file    Attends meetings of clubs or organizations: Not on file    Relationship status: Not on file  . Intimate partner violence:    Fear of current or ex partner: Not on file    Emotionally  abused: Not on file    Physically abused: Not on file    Forced sexual activity: Not on file  Other Topics Concern  . Not on file  Social History Narrative  . Not on file      Review of Systems  Constitutional: Negative.  Negative for chills, fatigue and unexpected weight change.  HENT: Negative.  Negative for congestion, rhinorrhea, sneezing and sore throat.   Eyes: Negative for redness.  Respiratory: Negative.  Negative for cough, chest tightness and shortness of breath.   Cardiovascular: Negative.  Negative for chest pain and palpitations.  Gastrointestinal: Negative.  Negative for abdominal pain, constipation, diarrhea, nausea and vomiting.  Endocrine: Negative.   Genitourinary: Negative.  Negative for dysuria and frequency.  Musculoskeletal: Negative.  Negative for  arthralgias, back pain, joint swelling and neck pain.  Skin: Negative.  Negative for rash.  Allergic/Immunologic: Negative.   Neurological: Negative.  Negative for tremors and numbness.  Hematological: Negative for adenopathy. Does not bruise/bleed easily.  Psychiatric/Behavioral: Negative.  Negative for behavioral problems, sleep disturbance and suicidal ideas. The patient is not nervous/anxious.     Vital Signs: BP (!) 145/96 (BP Location: Right Arm, Patient Position: Sitting, Cuff Size: Large)   Pulse (!) 105   Resp 16   Ht 6\' 2"  (1.88 m)   Wt (!) 301 lb 6.4 oz (136.7 kg)   SpO2 98%   BMI 38.70 kg/m    Physical Exam  Constitutional: He is oriented to person, place, and time. He appears well-developed and well-nourished. No distress.  HENT:  Head: Normocephalic and atraumatic.  Mouth/Throat: Oropharynx is clear and moist. No oropharyngeal exudate.  Eyes: Pupils are equal, round, and reactive to light. EOM are normal.  Neck: Normal range of motion. Neck supple. No JVD present. No tracheal deviation present. No thyromegaly present.  Cardiovascular: Normal rate, regular rhythm and normal heart sounds. Exam reveals no gallop and no friction rub.  No murmur heard. Pulmonary/Chest: Effort normal and breath sounds normal. No respiratory distress. He has no wheezes. He has no rales. He exhibits no tenderness.  Abdominal: Soft. There is no tenderness. There is no guarding.  Musculoskeletal: Normal range of motion.  Lymphadenopathy:    He has no cervical adenopathy.  Neurological: He is alert and oriented to person, place, and time. No cranial nerve deficit.  Skin: Skin is warm and dry. He is not diaphoretic.  Psychiatric: He has a normal mood and affect. His behavior is normal. Judgment and thought content normal.  Nursing note and vitals reviewed.   Assessment/Plan: 1. Testicular hypofunction Pt declined ordering more bloodwork because he can not afford to have it drawn. He  understands he needs his bloodwork in order to get his testosterone refilled. Pt given Free lab slip.  2. Gynecomastia Present, pt denies worse. Prolactin level has been ordered  3. Hyperlipidemia, unspecified hyperlipidemia type Not currently taking medications. Pt was educated about testosterone therapy can cause worsening in his lipid profile and he might have to take the medicine for it. 4. Elevated blood pressure reading Pt has elevated bp, will mointor at home, pt is noticed to be tachycardiac General Counseling: Maison verbalizes understanding of the findings of todays visit and agrees with plan of treatment. I have discussed any further diagnostic evaluation that may be needed or ordered today. We also reviewed his medications today. he has been encouraged to call the office with any questions or concerns that should arise related to todays visit.  Time spent: 25 Minutes  This patient was seen by Orson Gear AGNP-C in Collaboration with Dr Lavera Guise as a part of collaborative care agreement    Dr Lavera Guise Internal medicine

## 2018-08-01 ENCOUNTER — Encounter: Payer: Self-pay | Admitting: Adult Health

## 2019-11-29 ENCOUNTER — Emergency Department: Payer: Self-pay

## 2019-11-29 ENCOUNTER — Other Ambulatory Visit: Payer: Self-pay

## 2019-11-29 ENCOUNTER — Emergency Department
Admission: EM | Admit: 2019-11-29 | Discharge: 2019-11-29 | Disposition: A | Discharge status: Home / Self Care | Payer: Self-pay | Attending: Student | Admitting: Student

## 2019-11-29 DIAGNOSIS — M7651 Patellar tendinitis, right knee: Secondary | ICD-10-CM | POA: Insufficient documentation

## 2019-11-29 DIAGNOSIS — Z87891 Personal history of nicotine dependence: Secondary | ICD-10-CM | POA: Insufficient documentation

## 2019-11-29 NOTE — Discharge Instructions (Addendum)
Wear knee support and follow discharge care instruction.  Advised 600 mg ibuprofen 3 times a day for 3 to 5 days.

## 2019-11-29 NOTE — ED Triage Notes (Signed)
Pt arrived via POV to ER d/t right knee pain after getting out the shower yesterday. Pt states he was steeping out the shower and as he turned he felt his right knee pop. Pt is unable to fully bare weight on his right side. Pt denies any prior hx of injuries on the affected side.

## 2019-11-29 NOTE — ED Provider Notes (Signed)
[facility] Emergency Department Provider Note   ____________________________________________   First MD Initiated Contact with Patient 11/29/19 [phone]     (approximate)  I have reviewed the triage vital signs and the nursing notes.   HISTORY  Chief Complaint Knee Pain    HPI Steven Yu is a 24 y.o. male patient presents with 2 days of right knee pain.  Patient states there was a twisting incident as he stepped out of the shower and he felt a pop in his right knee.  Patient state unable to fully bear weight after the incident.  Patient rates pain as 5/10.  Patient described pain as "achy".  No palliative measure for complaint.         Past Medical History:  Diagnosis Date  . Acne     Patient Active Problem List   Diagnosis Date Noted  . Gynecomastia 02/15/2018  . Testicular hypofunction 10/22/2017  . Metabolic disorder 0000000    History reviewed. No pertinent surgical history.  Prior to Admission medications   Medication Sig Start Date End Date Taking? Authorizing Provider  atorvastatin (LIPITOR) 10 MG tablet Take 10 mg by mouth daily.    [provider]  ibuprofen (ADVIL,MOTRIN) 800 MG tablet Take 1 tablet (800 mg total) by mouth every 8 (eight) hours as needed. Patient not taking: Reported on 10/22/2017 08/24/15   Beers, Pierce Crane, PA-C  ranitidine (ZANTAC) 150 MG tablet Take one tablet twice a day for one week then 1 tablet once a day for 2 more weeks. Patient not taking: Reported on 10/22/2017 07/15/15   Ahmed Prima, MD  Syringe/Needle, Disp, (SYRINGE 3CC/25GX1") 25G X 1" 3 ML MISC To use with testosterone injection every other week 10/22/17   Ronnell Freshwater, NP  Testosterone 200 MG PLLT Inject 200 mg into the muscle every 14 (fourteen) days. Inject 1.5 ml   im every 2 weeks 10/22/17   Ronnell Freshwater, NP    Allergies Patient has no known allergies.  Family History  Problem Relation Age of Onset  .  Depression Mother   . Hypertension Father     Social History Social History   Tobacco Use  . Smoking status: Former Research scientist (life sciences)  . Smokeless tobacco: Never Used  . Tobacco comment: marijuana  Substance Use Topics  . Alcohol use: No  . Drug use: Not Currently    Types: Marijuana    Comment: socially    Review of Systems Constitutional: No fever/chills Eyes: No visual changes. ENT: No sore throat. Cardiovascular: Denies chest pain. Respiratory: Denies shortness of breath. Gastrointestinal: No abdominal pain.  No nausea, no vomiting.  No diarrhea.  No constipation. Genitourinary: Negative for dysuria. Musculoskeletal: Right knee pain.   Skin: Negative for rash. Neurological: Negative for headaches, focal weakness or numbness.   ____________________________________________   PHYSICAL EXAM:  VITAL SIGNS: ED Triage Vitals  Enc Vitals Group     BP 11/29/19 0920 (!) 159/98     Pulse Rate 11/29/19 0920 84     Resp 11/29/19 0920 16     Temp 11/29/19 0920 98.3 F (36.8 C)     Temp Source 11/29/19 0920 Oral     SpO2 11/29/19 0920 100 %     Weight 11/29/19 0923 290 lb (131.5 kg)     Height 11/29/19 0923 6\' 2"  (1.88 m)     Head Circumference --      Peak Flow --      Pain Score 11/29/19 0923 5  Pain Loc --      Pain Edu? --      Excl. in [place]? --    Constitutional: Alert and oriented. Well appearing and in no acute distress. Cardiovascular: Normal rate, regular rhythm. Grossly normal heart sounds.  Good peripheral circulation.  Elevated blood pressure. Respiratory: Normal respiratory effort.  No retractions. Lungs CTAB. Musculoskeletal: No obvious deformity/edema to the right knee.  No lower extremity tenderness nor edema.  No joint effusions. Neurologic:  Normal speech and language. No gross focal neurologic deficits are appreciated. No gait instability. Skin:  Skin is warm, dry and intact. No rash noted. Psychiatric: Mood and affect are normal. Speech and behavior are  normal.  ____________________________________________   LABS (all labs ordered are listed, but only abnormal results are displayed)  Labs Reviewed - No data to display ____________________________________________  EKG   ____________________________________________  RADIOLOGY  ED MD interpretation:    Official radiology report(s): DG Knee Complete 4 Views Right  Result Date: 11/29/2019 CLINICAL DATA:  24 year old male with a history of right knee pain EXAM: RIGHT KNEE - COMPLETE 4+ VIEW COMPARISON:  None. FINDINGS: No evidence of fracture, dislocation, or joint effusion. No evidence of arthropathy or other focal bone abnormality. Soft tissues are unremarkable. IMPRESSION: Negative. Electronically Signed   By: Corrie Mckusick D.O.   On: 11/29/2019 10:27    ____________________________________________   PROCEDURES  Procedure(s) performed (including Critical Care):  Procedures   ____________________________________________   INITIAL IMPRESSION / ASSESSMENT AND PLAN / ED COURSE  As part of my medical decision making, I reviewed the following data within the [place]     Patient presents with right knee pain secondary to a twisting incident yesterday.  Discussed negative x-ray findings with patient.  Physical exam is consistent with patella tendinitis.  Patient placed in knee immobilizer and given discharge care instructions.  Patient advised over-the-counter anti-inflammatory medication for 3 to 5 days.  Follow-up with PCP.    Steven Yu was evaluated in Emergency Department on 11/29/2019 for the symptoms described in the history of present illness. He was evaluated in the context of the global COVID-19 pandemic, which necessitated consideration that the patient might be at risk for infection with the SARS-CoV-2 virus that causes COVID-19. Institutional protocols and algorithms that pertain to the evaluation of patients at risk for COVID-19 are in a  state of rapid change based on information released by regulatory bodies including the CDC and federal and state organizations. These policies and algorithms were followed during the patient's care in the ED.       ____________________________________________   FINAL CLINICAL IMPRESSION(S) / ED DIAGNOSES  Final diagnoses:  Patellar tendinitis, right knee     ED Discharge Orders    None       Note:  This document was prepared using Dragon voice recognition software and may include unintentional dictation errors.    Sable Feil, PA-C 11/29/19 1040    Lilia Pro., MD 11/30/19 [phone]

## 2020-08-18 IMAGING — DX DG KNEE COMPLETE 4+V*R*
4 series · 4 of 4 positions shown · non-contrast
Comparison: None.

CLINICAL DATA: 23-year-old male with a history of right knee pain

EXAM:
RIGHT KNEE - COMPLETE 4+ VIEW

[knee lat]
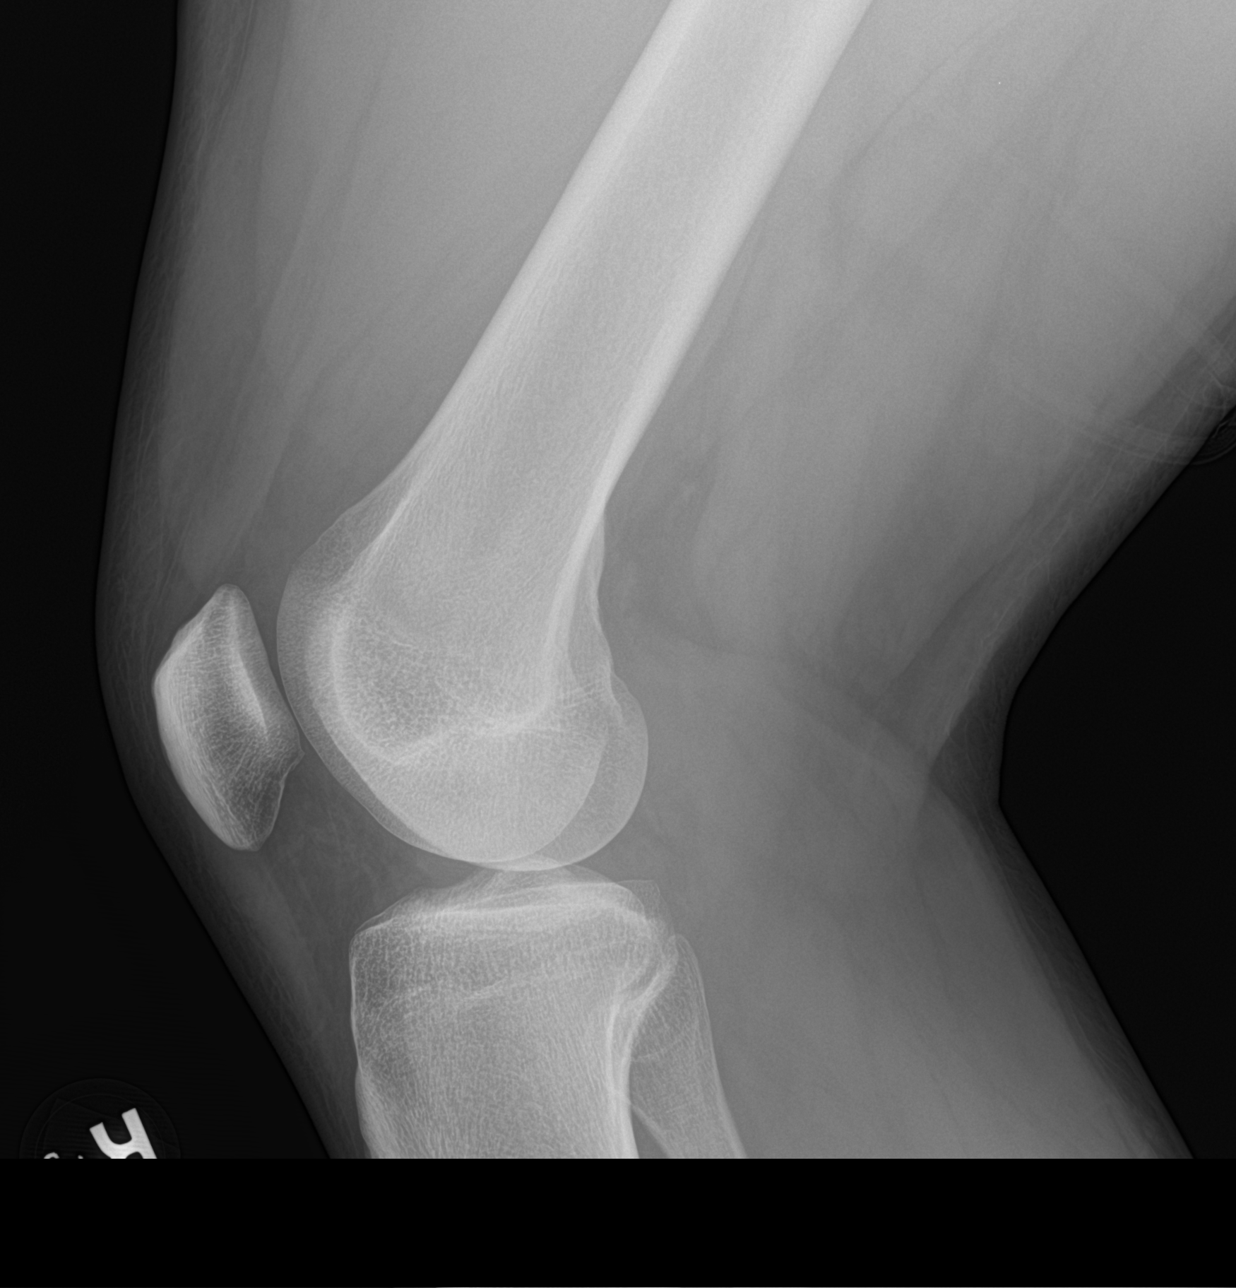

[knee ap]
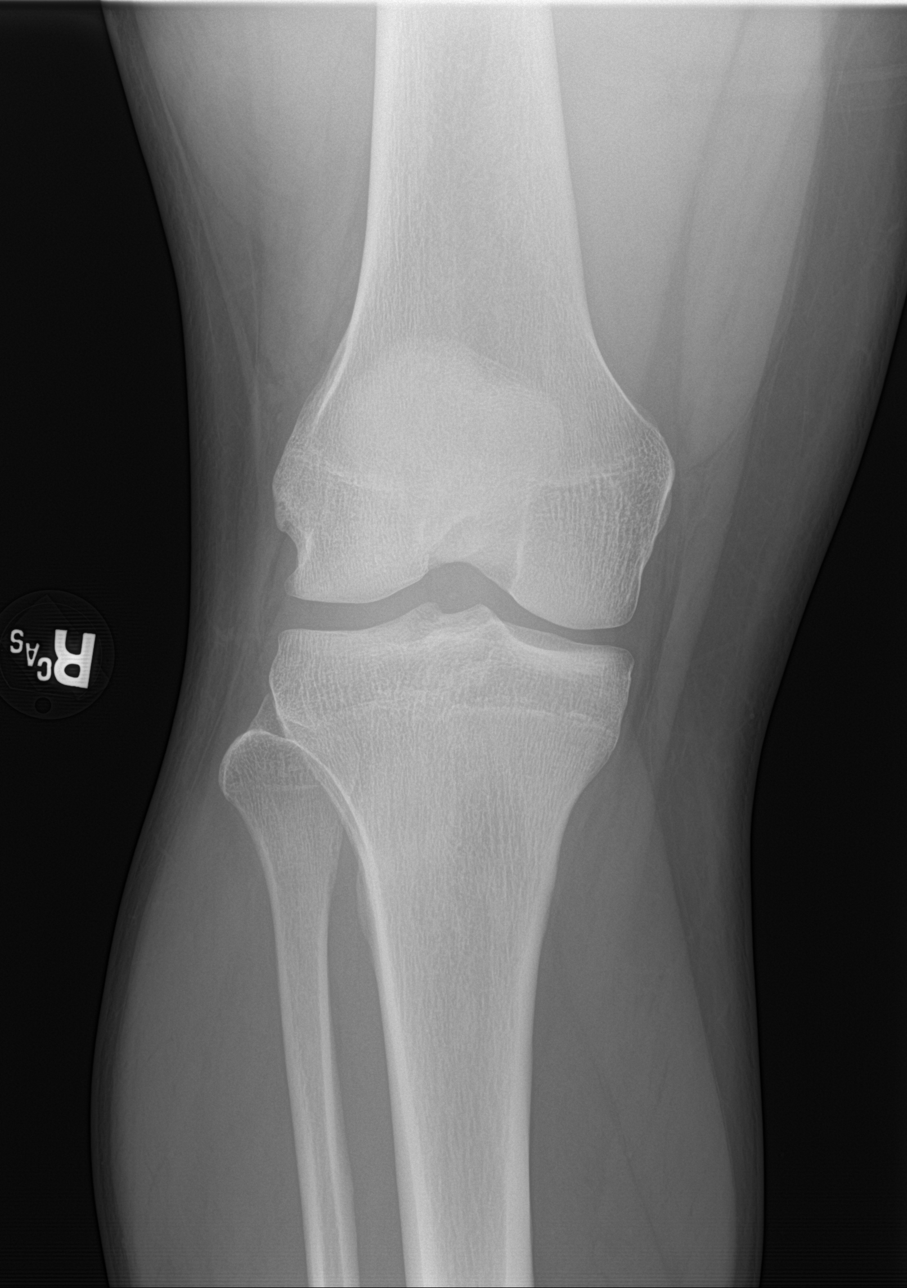

[knee obl (1 of 2)]
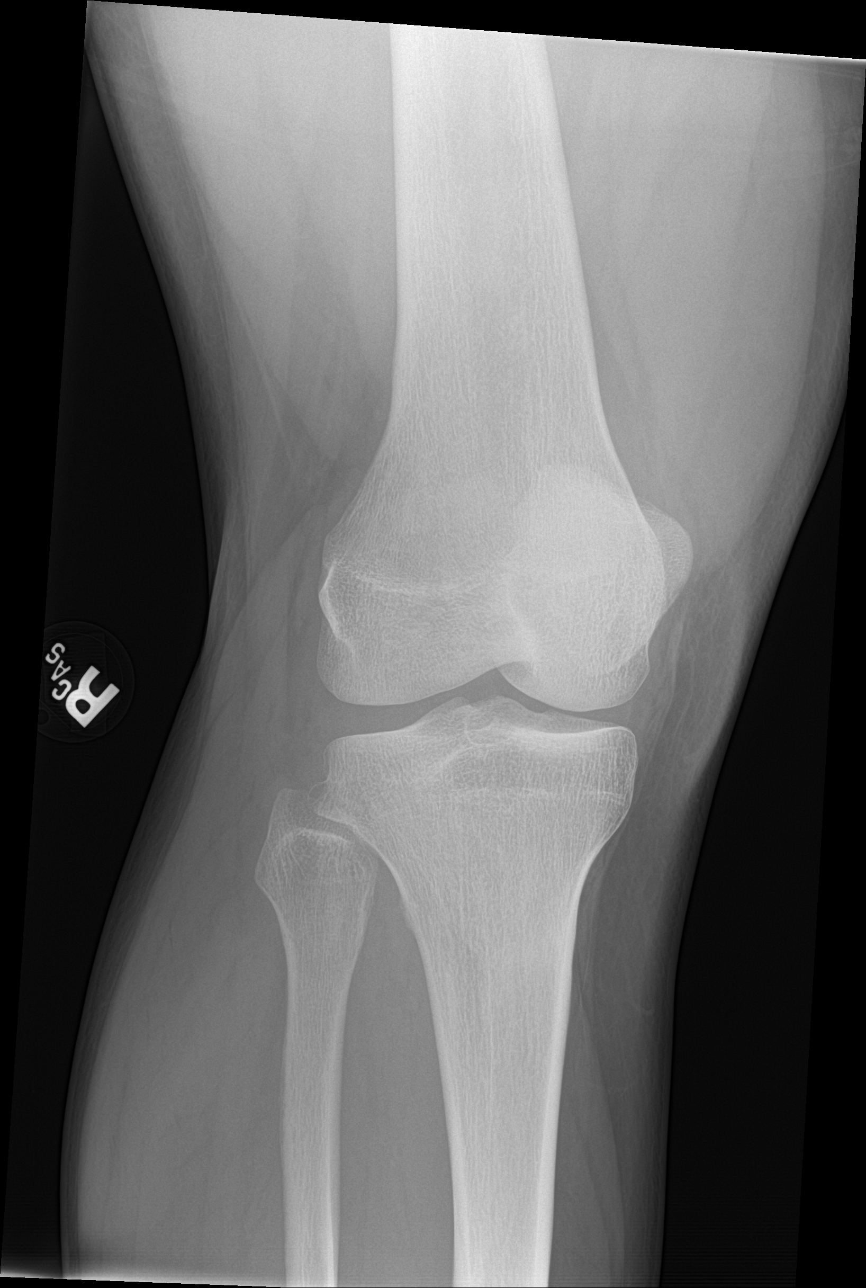

[knee obl (2 of 2)]
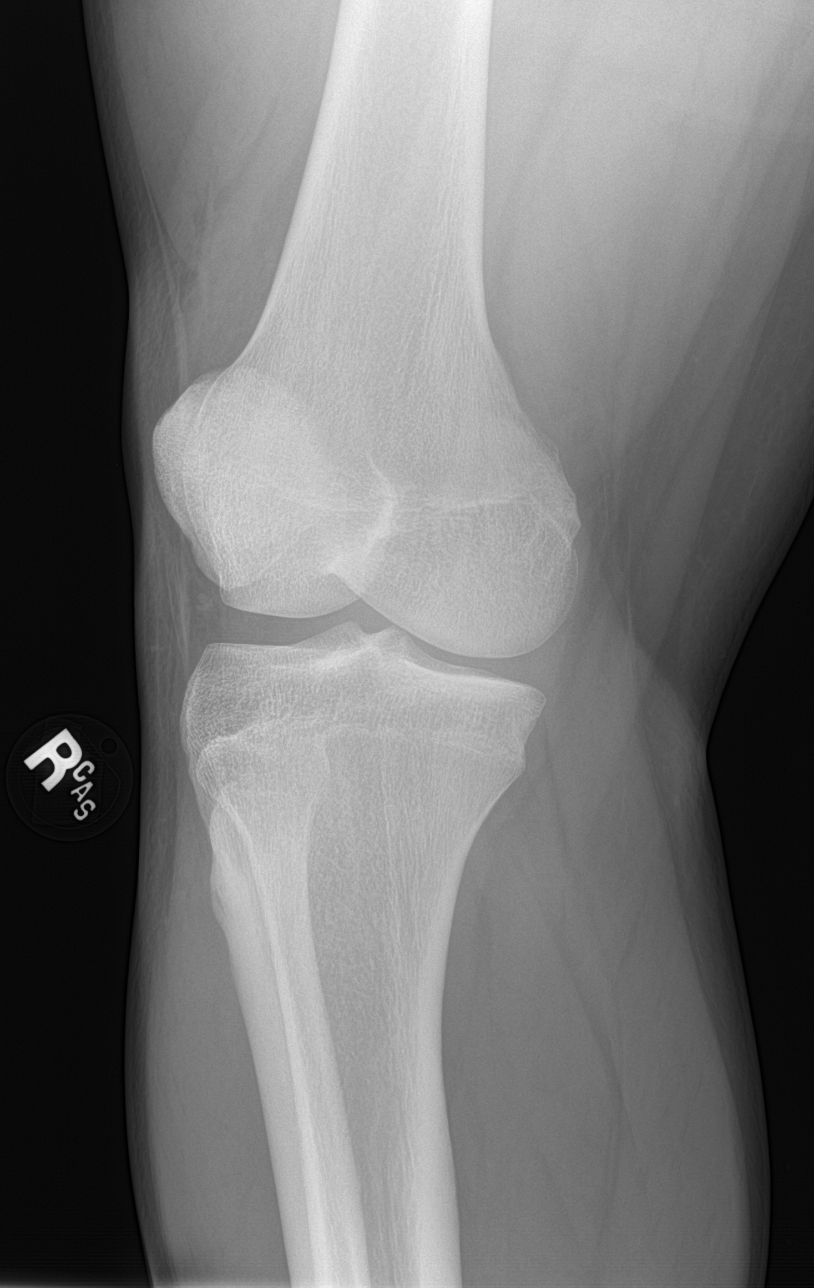

[4 of 4 positions shown; findings below may reference images not displayed]

FINDINGS: No evidence of fracture, dislocation, or joint effusion. No evidence
of arthropathy or other focal bone abnormality. Soft tissues are
unremarkable.
IMPRESSION: Negative.

## 2022-07-27 ENCOUNTER — Ambulatory Visit (INDEPENDENT_AMBULATORY_CARE_PROVIDER_SITE_OTHER): Payer: PRIVATE HEALTH INSURANCE | Admitting: Physician Assistant

## 2022-07-27 VITALS — BP 125/72 | HR 63 | Temp 98.4°F | Resp 16 | Ht 73.0 in | Wt 256.0 lb

## 2022-07-27 DIAGNOSIS — M79671 Pain in right foot: Secondary | ICD-10-CM

## 2022-07-27 DIAGNOSIS — R29898 Other symptoms and signs involving the musculoskeletal system: Secondary | ICD-10-CM | POA: Diagnosis not present

## 2022-07-27 DIAGNOSIS — Z7689 Persons encountering health services in other specified circumstances: Secondary | ICD-10-CM

## 2022-07-27 NOTE — Progress Notes (Signed)
[facility] [place], [place] [address]  Internal MEDICINE  Office Visit Note  Patient Name: Steven Yu  [id number]  [id number]  Date of Service: 08/02/2022   Complaints/HPI Pt is here for establishment of PCP. Chief Complaint  Patient presents with   New Patient (Initial Visit)   HPI Pt is here to reestablish care, he was last seen in office 4 years ago -he has been experiencing right foot pain, for almost a week -Last Friday it just started hurting when he got up. It will bother him walking to work. Hurts along lateral top portion and some numbness associated. He did golf the other day which he realizes probably didn't help as it started hurting more with this. He denies any injury, swelling, or bruising -Offered to go ahead and check xray vs refer to ortho and pt would like to see ortho. Did also explain that [facility] has walk-in/urgent care hours that he can schedule without referral, especially if any worsening prior to appt. 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 -marijuana smoker nightly, ecigs for 3 years. Will sometimes be able to quit but will pick back up again -He declines any labs or scheduling CPE at this time -Does not take any medication and reports he doesn't like to take anything  Current Medication: Outpatient Encounter Medications as of 07/27/2022  Medication Sig   [DISCONTINUED] atorvastatin (LIPITOR) 10 MG tablet Take 10 mg by mouth daily.   [DISCONTINUED] ibuprofen (ADVIL,MOTRIN) 800 MG tablet Take 1 tablet (800 mg total) by mouth every 8 (eight) hours as needed. (Patient not taking: Reported on  10/22/2017)   [DISCONTINUED] ranitidine (ZANTAC) 150 MG tablet Take one tablet twice a day for one week then 1 tablet once a day for 2 more weeks. (Patient not taking: Reported on 10/22/2017)   [DISCONTINUED] Syringe/Needle, Disp, (SYRINGE 3CC/25GX1") 25G X 1" 3 ML MISC To use with testosterone injection every other week   [DISCONTINUED] Testosterone 200 MG PLLT Inject 200 mg into the muscle every 14 (fourteen) days. Inject 1.5 ml   im every 2 weeks   No facility-administered encounter medications on file as of 07/27/2022.    Surgical History: No past surgical history on file.  Medical History: Past Medical History:  Diagnosis Date   Acne     Family History: Family History  Problem Relation Age of Onset   Depression Mother    Hypertension Father     Social History   Socioeconomic History   Marital status: Single    Spouse name: Not on file   Number of children: Not on file   Years of education: Not on file   Highest education level: Not on file  Occupational History   Not on file  Tobacco Use   Smoking status: Former   Smokeless tobacco: Never   Tobacco comments:    marijuana  Vaping Use   Vaping Use: Some days  Substance and Sexual Activity   Alcohol use: No   Drug use: Not Currently    Types: Marijuana    Comment: socially   Sexual activity: Yes  Other Topics Concern   Not on file  Social History Narrative   Not on file   Social Determinants of Health   Financial Resource Strain: Not on file  Food Insecurity: Not on file  Transportation Needs: Not on file  Physical Activity: Not on file  Stress: Not on file  Social Connections: Not on file  Intimate Partner Violence: Not on file     Review of Systems  Constitutional:  Negative for chills, fatigue and unexpected weight change.  HENT:  Negative for congestion, postnasal drip, rhinorrhea, sneezing and sore throat.   Eyes:  Negative for redness.  Respiratory:  Negative for cough, chest tightness and  shortness of breath.   Cardiovascular:  Negative for chest pain and palpitations.  Gastrointestinal:  Negative for abdominal pain, constipation, diarrhea, nausea and vomiting.  Genitourinary:  Negative for dysuria and frequency.  Musculoskeletal:  Positive for arthralgias. Negative for back pain, joint swelling and neck pain.  Skin:  Negative for rash.  Neurological: Negative.  Negative for tremors and numbness.  Hematological:  Negative for adenopathy. Does not bruise/bleed easily.  Psychiatric/Behavioral:  Negative for behavioral problems (Depression), sleep disturbance and suicidal ideas. The patient is not nervous/anxious.     Vital Signs: BP 125/72   Pulse 63   Temp 98.4 F (36.9 C)   Resp 16   Ht '6\' 1"'$  (1.854 m)   Wt 256 lb (116.1 kg)   SpO2 99%   BMI 33.78 kg/m    Physical Exam Vitals and nursing note reviewed.  Constitutional:      General: He is not in acute distress.    Appearance: Normal appearance. He is well-developed. He is obese. He is not diaphoretic.  HENT:     Head: Normocephalic and atraumatic.     Mouth/Throat:     Pharynx: No oropharyngeal exudate.  Eyes:     Pupils: Pupils are equal, round, and reactive to light.  Neck:     Thyroid: No thyromegaly.     Vascular: No JVD.     Trachea: No tracheal deviation.  Cardiovascular:     Rate and Rhythm: Normal rate and regular rhythm.     Heart sounds: Normal heart sounds. No murmur heard.    No friction rub. No gallop.  Pulmonary:     Effort: Pulmonary effort is normal. No respiratory distress.     Breath sounds: No wheezing or rales.  Chest:     Chest wall: No tenderness.  Abdominal:     General: Bowel sounds are normal.     Palpations: Abdomen is soft.  Musculoskeletal:        General: Tenderness present. No swelling or deformity. Normal range of motion.     Cervical back: Normal range of motion and neck supple.     Comments: Mild tenderness along lateral top of right foot, no swelling or deformity.  Pulses intact.   Lymphadenopathy:     Cervical: No cervical adenopathy.  Skin:    General: Skin is warm and dry.     Comments: Small well circumscribed nodule on left forearm consistent with cyst  Neurological:     Mental Status: He is alert and oriented to person, place, and time.     Cranial Nerves: No cranial nerve deficit.  Psychiatric:        Behavior: Behavior normal.        Thought Content: Thought content normal.        Judgment: Judgment normal.       Assessment/Plan: 1. Right foot pain Denies any injury to it  and declines xray at this time. Advised to ice, rest, and try anti-inflammatory such as ibuprofen. Will refer to ortho, but also advised of walk in option at [facility]. - AMB referral to orthopedics  2. Knee clicking Has started exercising recently and would like to evaluate further - AMB referral to orthopedics  3. Encounter to establish care Reviewed medical hx, pt declines labs at this time   General Counseling: Jhostin verbalizes understanding of the findings of todays visit and agrees with plan of treatment. I have discussed any further diagnostic evaluation that may be needed or ordered today. We also reviewed his medications today. he has been encouraged to call the office with any questions or concerns that should arise related to todays visit.    Counseling:    Orders Placed This Encounter  Procedures   AMB referral to orthopedics    No orders of the defined types were placed in this encounter.    This patient was seen by Drema Dallas, PA-C in collaboration with Dr. Clayborn Bigness as a part of collaborative care agreement.   Time spent:35 Minutes

## 2022-07-28 ENCOUNTER — Telehealth: Payer: Self-pay | Admitting: Physician Assistant

## 2022-07-28 NOTE — Telephone Encounter (Signed)
Awaiting 07/28/22 office notes for Orthopedic referral-Toni

## 2022-07-31 ENCOUNTER — Telehealth: Payer: Self-pay | Admitting: Physician Assistant

## 2022-07-31 NOTE — Telephone Encounter (Signed)
Patient called requesting telephone # for orthopedic he is being referred to. I explained to him I am still waiting on office notes before I can send referral. I do not know @ this time who he will be referred to. Per his request, I emailed his work note for 07/27/22 visit-Toni

## 2022-08-02 ENCOUNTER — Telehealth: Payer: Self-pay | Admitting: Physician Assistant

## 2022-08-02 NOTE — Telephone Encounter (Signed)
Orthopedic referral sent via [facility] to [facility]

## 2022-08-08 ENCOUNTER — Telehealth: Payer: Self-pay | Admitting: Physician Assistant

## 2022-08-08 NOTE — Telephone Encounter (Signed)
Orthopedic appointment 08/11/22 @ [facility]
